# Patient Record
Sex: Female | Born: 1985 | Marital: Single | Smoking: Never smoker
Health system: Southern US, Community
[De-identification: ages and names within clinical notes are randomized; demographics above are authoritative.]

## PROBLEM LIST (undated history)

## (undated) DIAGNOSIS — L309 Dermatitis, unspecified: Principal | ICD-10-CM

## (undated) DIAGNOSIS — L500 Allergic urticaria: Secondary | ICD-10-CM

## (undated) HISTORY — DX: Allergic urticaria: L50.0

## (undated) HISTORY — DX: Dermatitis, unspecified: L30.9

---

## 2015-12-01 ENCOUNTER — Other Ambulatory Visit: Payer: Self-pay | Admitting: Infectious Disease

## 2015-12-01 ENCOUNTER — Ambulatory Visit
Admission: RE | Admit: 2015-12-01 | Discharge: 2015-12-01 | Disposition: A | Discharge status: Home / Self Care | Payer: No Typology Code available for payment source | Source: Ambulatory Visit | Attending: Infectious Disease | Admitting: Infectious Disease

## 2015-12-01 DIAGNOSIS — R9389 Abnormal findings on diagnostic imaging of other specified body structures: Secondary | ICD-10-CM

## 2016-05-09 ENCOUNTER — Encounter: Payer: Self-pay | Admitting: Allergy and Immunology

## 2016-05-09 ENCOUNTER — Ambulatory Visit (INDEPENDENT_AMBULATORY_CARE_PROVIDER_SITE_OTHER): Payer: BLUE CROSS/BLUE SHIELD | Admitting: Allergy and Immunology

## 2016-05-09 VITALS — BP 126/74 | HR 92 | Temp 98.2°F | Resp 20 | Ht 65.0 in | Wt 130.6 lb

## 2016-05-09 DIAGNOSIS — L500 Allergic urticaria: Secondary | ICD-10-CM

## 2016-05-09 DIAGNOSIS — L309 Dermatitis, unspecified: Secondary | ICD-10-CM

## 2016-05-09 DIAGNOSIS — L253 Unspecified contact dermatitis due to other chemical products: Secondary | ICD-10-CM | POA: Insufficient documentation

## 2016-05-09 HISTORY — DX: Dermatitis, unspecified: L30.9

## 2016-05-09 HISTORY — DX: Allergic urticaria: L50.0

## 2016-05-09 NOTE — Assessment & Plan Note (Addendum)
The patient's history and negative skin test results suggest the possibility of contact dermatitis. We will proceed with patch testing.  She will return for T.R.U.E. patch test placement.  Instructions have been provided regarding keeping the patches clean and dry as well as returning at appropriate intervals for patch test interpretation.  Continue to avoid suspected triggers of dermatitis and continue topical corticosteroid as needed.

## 2016-05-09 NOTE — Progress Notes (Signed)
New Patient Note  RE: Catherine Kirk MRN: [id number] DOB: 1985/06/13 Date of Office Visit: 05/09/2016  Referring provider: No ref. provider found Primary care provider: No PCP Per Patient  Chief Complaint: Rash   History of present illness: Catherine Kirk is a 31 y.o. female presenting today for evaluation of rash.  She reports that since she was approximately 31 years old she has been developing a rash, particularly on her arms, axillae, and on the backs of her legs.  The rash is described as small red bumps, occasionally with vesicles which are pruritic and come and go.  She believes that the rash is triggered by contact with certain fabrics, cosmetics, and deodorants.  She has had to discontinue using deodorants because of the discomfort it was causing to her axillae.  She grew up in the [place] and was told by a physician there to avoid food dyes as they may be contributing to the rash.   Assessment and plan: Dermatitis The patient's history and negative skin test results suggest the possibility of contact dermatitis. We will proceed with patch testing.  She will return for T.R.U.E. patch test placement.  Instructions have been provided regarding keeping the patches clean and dry as well as returning at appropriate intervals for patch test interpretation.  Continue to avoid suspected triggers of dermatitis and continue topical corticosteroid as needed.   Diagnostics: Environmental skin testing:  Negative despite a positive histamine control. Food allergen skin testing:  Negative despite a positive histamine control.    Physical examination: Blood pressure 126/74, pulse 92, temperature 98.2 F (36.8 C), temperature source Oral, resp. rate 20, height  (1.651 m), weight 130 lb 9.6 oz (59.2 kg), SpO2 97 %.  General: Alert, interactive, in no acute distress. HEENT: TMs pearly gray, turbinates mildly edematous without discharge, post-pharynx  unremarkable. Neck: Supple without lymphadenopathy. Lungs: Clear to auscultation without wheezing, rhonchi or rales. CV: Normal S1, S2 without murmurs. Abdomen: Nondistended, nontender. Skin: Warm and dry, without lesions or rashes. Extremities:  No clubbing, cyanosis or edema. Neuro:   Grossly intact.  Review of systems:  Review of systems negative except as noted in HPI / PMHx or noted below: Review of Systems  Constitutional: Negative.   HENT: Negative.   Eyes: Negative.   Respiratory: Negative.   Cardiovascular: Negative.   Gastrointestinal: Negative.   Genitourinary: Negative.   Musculoskeletal: Negative.   Skin: Negative.   Neurological: Negative.   Endo/Heme/Allergies: Negative.   Psychiatric/Behavioral: Negative.     Past medical history:  Past Medical History:  Diagnosis Date  . 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 Dermatitis 05/09/2016    Past surgical history:  Past Surgical History:  Procedure Laterality Date  . CESAREAN SECTION  2015    Family history: Family History  Problem Relation Age of Onset  . Allergic rhinitis Neg Hx   . Angioedema Neg Hx   . Asthma Neg Hx   . Eczema Neg Hx   . Immunodeficiency Neg Hx   . Urticaria Neg Hx     Social history: Social History   Social History  . Marital status: Single    Spouse name: N/A  . Number of children: N/A  . Years of education: N/A   Occupational History  . Not on file.   Social History Main Topics  . Smoking status: Never Smoker  . Smokeless tobacco: Never Used  . Alcohol use Not on file  . Drug use: Unknown  . Sexual activity: Not  on file   Other Topics Concern  . Not on file   Social History Narrative  . No narrative on file   Environmental History: The patient lives in a house with carpeting throughout, gas heat, and central air.  She is a nonsmoker without pets.  There is no known mold/water damage in the home.  Allergies as of 05/09/2016   No Known Allergies     Medication List     as of 05/09/2016  3:00 PM   You have not been prescribed any medications.     Known medication allergies: No Known Allergies  I appreciate the opportunity to take part in Robyne's care. Please do not hesitate to contact me with questions.  Sincerely,   R. Jorene Guest, MD

## 2016-05-09 NOTE — Patient Instructions (Addendum)
Dermatitis The patient's history and negative skin test results suggest the possibility of contact dermatitis. We will proceed with patch testing.  She will return for T.R.U.E. patch test placement.  Instructions have been provided regarding keeping the patches clean and dry as well as returning at appropriate intervals for patch test interpretation.  Continue to avoid suspected triggers of dermatitis and continue topical corticosteroid as needed.   Return in about 2 days (around 05/11/2016) for patch test placement.

## 2016-05-11 ENCOUNTER — Ambulatory Visit (INDEPENDENT_AMBULATORY_CARE_PROVIDER_SITE_OTHER): Payer: BLUE CROSS/BLUE SHIELD | Admitting: Allergy & Immunology

## 2016-05-11 ENCOUNTER — Encounter: Payer: Self-pay | Admitting: Allergy & Immunology

## 2016-05-11 VITALS — BP 108/70 | HR 96 | Temp 98.5°F | Resp 18 | Ht 65.0 in | Wt 130.0 lb

## 2016-05-11 DIAGNOSIS — L309 Dermatitis, unspecified: Secondary | ICD-10-CM | POA: Diagnosis not present

## 2016-05-11 NOTE — Progress Notes (Signed)
FOLLOW UP  Date of Service/Encounter:  05/11/16   Assessment:   Dermatitis    Plan/Recommendations:   1. Dermatitis - TRUE Test patches placed today. - Come back on Friday for the first read and then on Monday for the second read.  - Hopefully this will help [place] determine which chemicals are causing the reactions.  2. Return in about 2 days (around 05/13/2016).    Subjective:   Catherine Kirk is a 31 y.o. female presenting today for follow up of  Chief Complaint  Patient presents with  . Allergies    patch testing    Catherine Kirk has a history of the following: Patient Active Problem List   Diagnosis Date Noted  . 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 Dermatitis 05/09/2016    History obtained from: chart review and patient.  Catherine Kirk was referred by No PCP Per Patient.      Catherine Kirk is a 31 y.o. female presenting for a follow up visit. She was last seen two days ago by Dr. Nunzio Cobbs for evaluation of a rash. At that time, allergy testing including environmentals as well as foods were negative with adequate controls. Dr. Nunzio Cobbs felt that this was likely related to a contact dermatitis and recommended patch testing, which brought her in today.   Since last visit, she is doing well. She had no residual itching from her skin testing from 2 days ago. She remains on Cerve lotion which she has used for 2 weeks and seems to be working well. She rarely has outbreaks at this time because she is avoiding quite a large lots of water and chemicals. He only uses certain lotions and wears certain clothes. She is interested in narrowing down what chemical might be triggering the rash. She endorses feeling good today without any adverse symptoms. She has not been on any antihistamines.  Otherwise, there have been no changes to her past medical history, surgical history, family history, or social history. She was born in the [place] and recently moved to the [place]  roughly one year ago. Her mother lives in [place], which brought her to this area. 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 She is in the middle of learning English so that she can establish her practice here in the [place].    Review of Systems: a 14-point review of systems is pertinent for what is mentioned in HPI.  Otherwise, all other systems were negative. Constitutional: negative other than that listed in the HPI Eyes: negative other than that listed in the HPI Ears, nose, mouth, throat, and face: negative other than that listed in the HPI Respiratory: negative other than that listed in the HPI Cardiovascular: negative other than that listed in the HPI Gastrointestinal: negative other than that listed in the HPI Genitourinary: negative other than that listed in the HPI Integument: negative other than that listed in the HPI Hematologic: negative other than that listed in the HPI Musculoskeletal: negative other than that listed in the HPI Neurological: negative other than that listed in the HPI Allergy/Immunologic: negative other than that listed in the HPI    Objective:   Blood pressure 108/70, pulse 96, temperature 98.5 F (36.9 C), temperature source Oral, resp. rate 18, height  (1.651 m), weight 130 lb (59 kg), SpO2 98 %. Body mass index is 21.63 kg/m.   Physical Exam:  General: Alert, interactive, in no acute distress. Pleasant female.  Eyes: No conjunctival injection present on the right, No conjunctival injection present on the left, PERRL bilaterally, No discharge on the right, No discharge on the left and No Horner-Trantas dots present Ears: Right TM pearly gray with normal light reflex, Left TM pearly gray with normal light reflex, Right TM intact without perforation and Left TM intact without perforation.  Nose/Throat: External nose within normal limits and septum midline, turbinates edematous without discharge,  post-pharynx mildly erythematous without cobblestoning in the posterior oropharynx. Tonsils 2+ without exudates Neck: Supple without thyromegaly. Lungs: Clear to auscultation without wheezing, rhonchi or rales. No increased work of breathing. CV: Normal S1/S2, no murmurs. Capillary refill <2 seconds.  Skin: Warm and dry, without lesions or rashes. Back is mostly clear with 2-3 isolated hyperpigmented lesions.  Neuro:   Grossly intact. No focal deficits appreciated. Responsive to questions.   Diagnostic studies: none     Catherine Bonds, MD [facility]

## 2016-05-11 NOTE — Patient Instructions (Addendum)
1. Dermatitis - TRUE Test patches placed today. - Come back on Friday for the first read and then on Monday for the second read.  - Hopefully this will help [place] determine which chemicals are causing the reactions.  2. Return in about 2 days (around 05/13/2016).  Please inform us of any Emergency Department visits, hospitalizations, or changes in symptoms. Call us before going to the ED for breathing or allergy symptoms since we might be able to fit you in for a sick visit. Feel free to contact us anytime with any questions, problems, or concerns.  It was a pleasure to meet you today! Happy spring! Good luck with the dental practice!   Websites that have reliable patient information: 1. American Academy of Asthma, Allergy, and Immunology: www.aaaai.org 2. Food Allergy Research and Education (FARE): foodallergy.org 3. Mothers of Asthmatics: http://www.asthmacommunitynetwork.org 4. American College of Allergy, Asthma, and Immunology: www.acaai.org

## 2016-05-13 ENCOUNTER — Ambulatory Visit: Payer: BLUE CROSS/BLUE SHIELD | Admitting: Allergy

## 2016-05-13 DIAGNOSIS — L309 Dermatitis, unspecified: Secondary | ICD-10-CM

## 2016-05-13 NOTE — Progress Notes (Signed)
    Follow-up Note  RE: Fronia Depass MRN: [id number] DOB: 04-Aug-1985 Date of Office Visit: 05/13/2016  Primary care provider: No PCP Per Patient Referring provider: No ref. provider found   Janyiah returns to the office today for the initial patch test interpretation, given suspected history of contact dermatitis.    Diagnostics:  TRUE TEST 48 hour reading:  Positive for #28 gold with erythematous papule.  #1 nickel, #7 colophony, #25 diazolidinyl urea, #36 2-bromo-2-nitropropane-1,3-diol were all mild irritant reactions with scaling.    Plan:  Allergic contact dermatitis  The patient has been provided detailed information regarding the substances she is sensitive to, as well as products containing the substances.  Meticulous avoidance of these substances is recommended. If avoidance is not possible, the use of barrier creams or lotions is recommended. If symptoms persist or progress despite meticulous avoidance of above substances, dermatology evaluation may be warranted.  Asthma control goals:   Full participation in all desired activities (may need albuterol before activity)  Albuterol use two time or less a week on average (not counting use with activity)  Cough interfering with sleep two time or less a month  Oral steroids no more than once a year  No hospitalizations  Margo Aye, MD [facility]

## 2016-05-16 ENCOUNTER — Encounter: Payer: Self-pay | Admitting: Allergy and Immunology

## 2016-05-16 ENCOUNTER — Ambulatory Visit: Payer: BLUE CROSS/BLUE SHIELD | Admitting: Allergy and Immunology

## 2016-05-16 DIAGNOSIS — L253 Unspecified contact dermatitis due to other chemical products: Secondary | ICD-10-CM

## 2016-05-16 NOTE — Assessment & Plan Note (Signed)
   The patient has been provided detailed information regarding the substances she is sensitive to, as well as products containing the substances.  Meticulous avoidance of these substances is recommended. If avoidance is not possible, the use of barrier creams or lotions is recommended.  If symptoms persist or progress despite meticulous avoidance of Balsam of [place] and Gold Sodium Thiosulfate, dermatology evaluation may be warranted.

## 2016-05-16 NOTE — Patient Instructions (Addendum)
Contact dermatitis  The patient has been provided detailed information regarding the substances she is sensitive to, as well as products containing the substances.  Meticulous avoidance of these substances is recommended. If avoidance is not possible, the use of barrier creams or lotions is recommended.  If symptoms persist or progress despite meticulous avoidance of Balsam of [place] and Gold Sodium Thiosulfate, dermatology evaluation may be warranted.

## 2016-05-16 NOTE — Progress Notes (Signed)
    Follow-up Note  RE: Catherine Kirk MRN: [id number] DOB: 17-Apr-1985 Date of Office Visit: 05/16/2016  Primary care provider: No PCP Per Patient Referring provider: No ref. provider found   Catherine Kirk returns to the office today for the final patch test interpretation, given suspected history of contact dermatitis.    Diagnostics:  TRUE TEST 96 hour reading: Positive to [place] of [place] and Gold Sodium Thiosulfate.  Assessment and Plan:   Allergic contact dermatitis  The patient has been provided detailed information regarding the substances she is sensitive to, as well as products containing the substances.  Meticulous avoidance of these substances is recommended. If avoidance is not possible, the use of barrier creams or lotions is recommended.  If symptoms persist or progress despite meticulous avoidance of Balsam of [place] and Gold Sodium Thiosulfate, dermatology evaluation may be warranted.

## 2017-11-24 IMAGING — CR DG CHEST 1V
1 series · 1 of 1 positions shown · non-contrast
Comparison: None.

CLINICAL DATA: Positive PPD

EXAM:
CHEST 1 VIEW

[w chest pa]
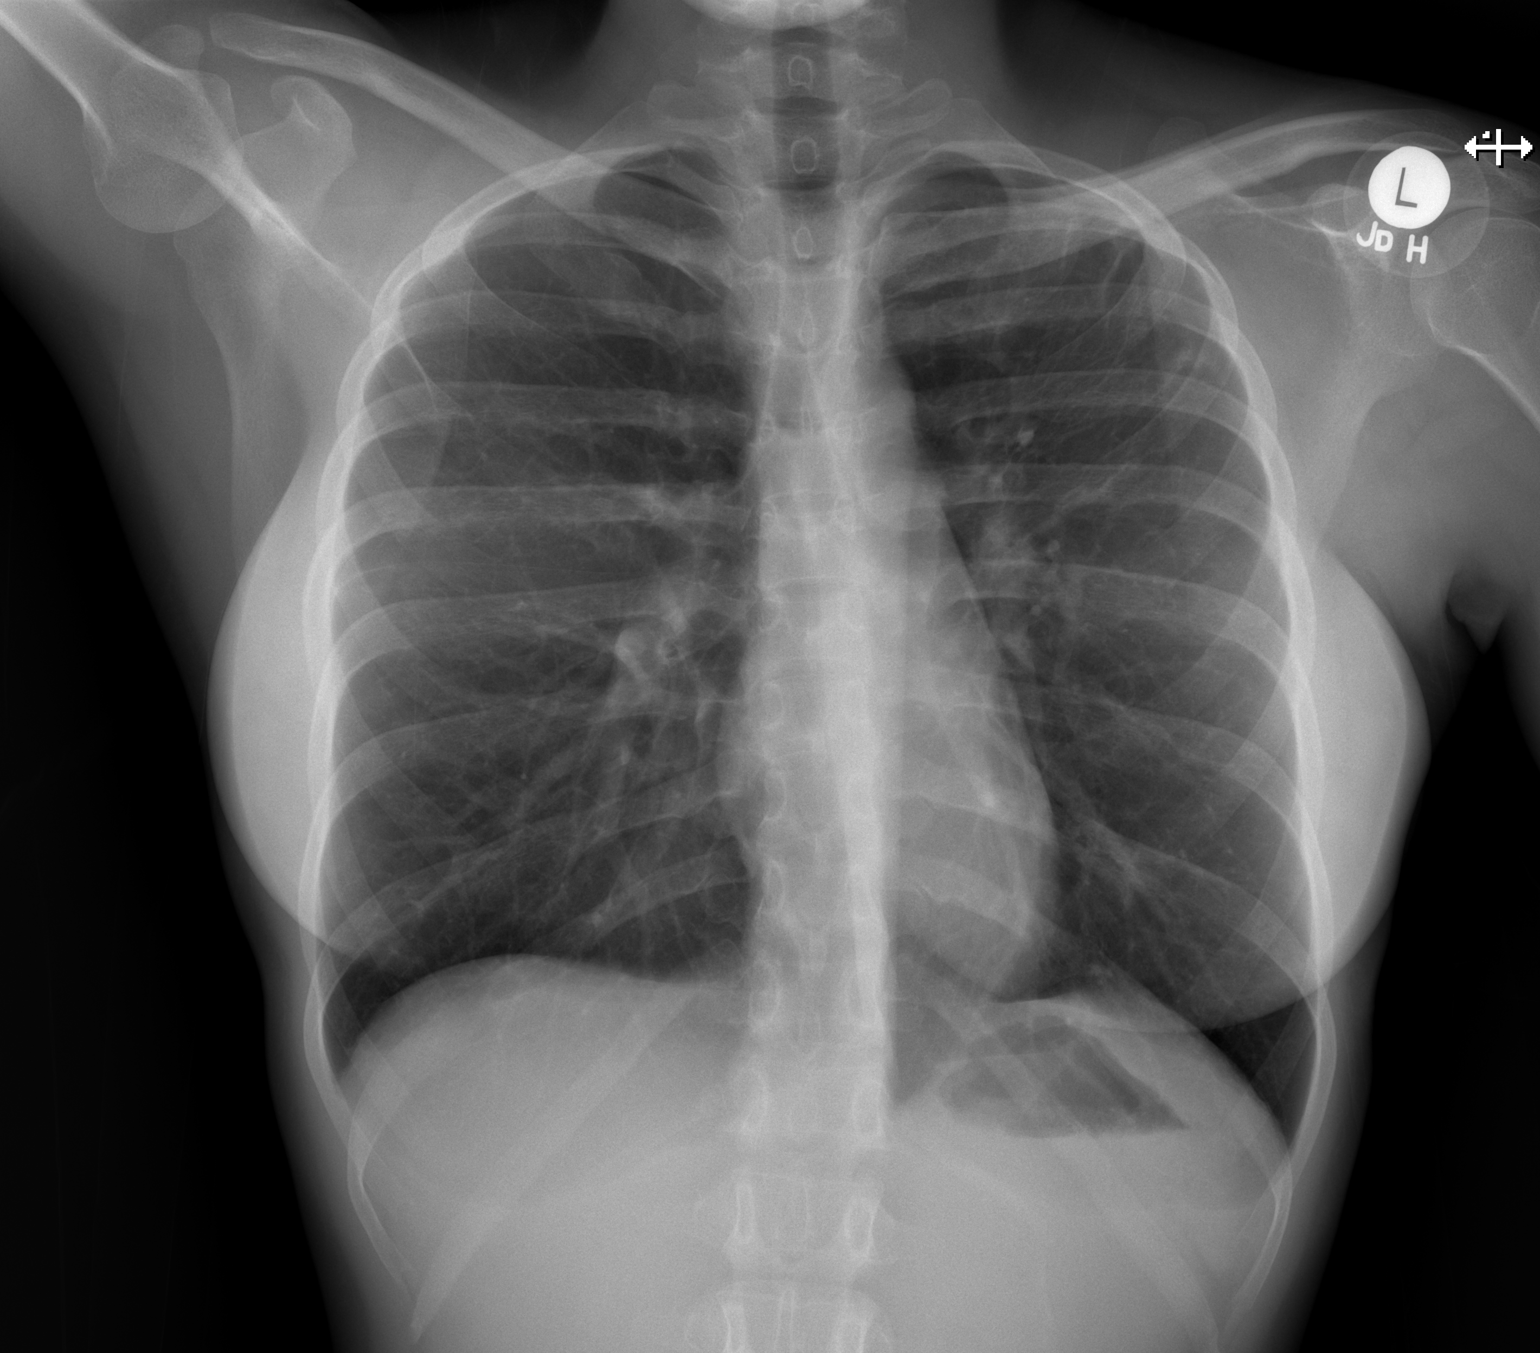

[1 of 1 positions shown; findings below may reference images not displayed]

FINDINGS: Normal heart size. Lungs clear. No pneumothorax. No pleural
effusion.
IMPRESSION: No active disease.

## 2018-01-23 ENCOUNTER — Other Ambulatory Visit: Payer: Self-pay

## 2018-01-23 ENCOUNTER — Encounter: Payer: Self-pay | Admitting: Physical Therapy

## 2018-01-23 ENCOUNTER — Ambulatory Visit: Payer: BLUE CROSS/BLUE SHIELD | Attending: Obstetrics & Gynecology | Admitting: Physical Therapy

## 2018-01-23 DIAGNOSIS — M62838 Other muscle spasm: Secondary | ICD-10-CM | POA: Insufficient documentation

## 2018-01-23 DIAGNOSIS — M6281 Muscle weakness (generalized): Secondary | ICD-10-CM | POA: Diagnosis present

## 2018-01-23 NOTE — Therapy (Signed)
[facility] [address], [address] [place], [place], [address] Phone: [phone]   Fax:  [phone]  Physical Therapy Evaluation  Patient Details  Name: Catherine Kirk MRN: [id number] Date of Birth: Mar 30, 1985 Referring Provider (PT): Dr. Hoover BrownsEma Kulwa   Encounter Date: 01/23/2018  PT End of Session - 01/23/18 1111    Visit Number  1    Date for PT Re-Evaluation  04/17/18    PT Start Time  1015    PT Stop Time  1050    PT Time Calculation (min)  35 min    Activity Tolerance  Patient tolerated treatment well    Behavior During Therapy  [facility] for tasks assessed/performed       Past Medical History:  Diagnosis Date  . 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 Dermatitis 05/09/2016    Past Surgical History:  Procedure Laterality Date  . CESAREAN SECTION  2015    There were no vitals filed for this visit.   Subjective Assessment - 01/23/18 1023    Subjective  Patient reports pain since she has been having sex. Patient started intercourse around 32 years old. Pain at the entrance of penetration. Sometimes has pain afterwards/     Patient Stated Goals  reduce pain with intercourse    Currently in Pain?  Yes    Pain Score  7     Pain Location  Vagina    Pain Orientation  Mid    Pain Descriptors / Indicators  Cramping;Burning;Sore    Pain Type  Chronic pain    Pain Onset  More than a month ago    Pain Frequency  Intermittent    Aggravating Factors   tampons, intercourse, vaginal exams; wearing tight pants    Pain Relieving Factors  no penetration in the vaginal canal         [facility] - 01/23/18 0001      Assessment   Medical Diagnosis  N94.10 unspecified dyspareunia    Referring Provider (PT)  Dr. Hoover BrownsEma Kulwa    Prior Therapy  none      Precautions   Precautions  None      Restrictions   Weight Bearing Restrictions  No      Balance Screen   Has the patient fallen in the past 6 months  No    Has the  patient had a decrease in activity level because of a fear of falling?   No    Is the patient reluctant to leave their home because of a fear of falling?   No      Home Public house managernvironment   Living Environment  Private residence      Prior Function   Level of Independence  Independent    Leisure  workout      Cognition   Overall Cognitive Status  Within Functional Limits for tasks assessed      Observation/Other Assessments   Skin Integrity  tightness in the c-section scar      Posture/Postural Control   Posture/Postural Control  No significant limitations      ROM / Strength   AROM / PROM / Strength  AROM;PROM;Strength      Strength   Overall Strength Comments  abdominal strength 2/5    Right Hip Extension  4/5    Right Hip ABduction  4/5      Palpation   Palpation comment  tenderness suprapubic area  Objective measurements completed on examination: See above findings.    Pelvic Floor Special Questions - 01/23/18 0001    Prior Pregnancies  Yes    Number of Pregnancies  1    Number of C-Sections  1    Currently Sexually Active  Yes    Is this Painful  Yes    Marinoff Scale  pain prevents any attempts at intercourse    Urinary Leakage  No    Urinary urgency  No    Urinary frequency  n    Fecal incontinence  No    Pelvic Floor Internal Exam  Patient confirms indentification and approves PT to assess pelvic floor and treat    Exam Type  Vaginal    Palpation  very tender in the introitus, perineal body,        [facility] Adult PT Treatment/Exercise - 01/23/18 0001      Self-Care   Self-Care  Other Self-Care Comments    Other Self-Care Comments   how to perform perineal massage; you tube videos on pelvic floor meditation and pelvic floor stretches             PT Education - 01/23/18 1109    Education Details  perineal massage; you tube video for pelvic floor meditation and pelvic floor stretches    Person(s) Educated  Patient    Methods   Explanation;Demonstration;Handout    Comprehension  Returned demonstration;Verbalized understanding       PT Short Term Goals - 01/23/18 1117      PT SHORT TERM GOAL #1   Title  independent with initial HEP    Time  4    Period  Weeks    Status  New    Target Date  02/20/18      PT SHORT TERM GOAL #2   Title  understand how to perform perineal massage to hav ethe tissue soften up    Time  4    Period  Weeks    Status  New    Target Date  02/20/18      PT SHORT TERM GOAL #3   Title  starting to use dilators to expand the perineal canal for penile penetration    Time  4    Period  Weeks    Status  New    Target Date  02/20/18      PT SHORT TERM GOAL #4   Title  pain with intercourse decreased >/= 25%    Time  4    Period  Weeks    Status  Deferred    Target Date  02/20/18        PT Long Term Goals - 01/23/18 1207      PT LONG TERM GOAL #1   Title  independent with HEP and understand how to progress herself    Time  12    Period  Weeks    Status  New    Target Date  04/17/18      PT LONG TERM GOAL #2   Title  ability to use the largest dilator to expand the vaginal canal for intercourse    Time  12    Period  Weeks    Status  New    Target Date  04/17/18      PT LONG TERM GOAL #3   Title  penile penetration in vaginal canal decreased >/= 80% due to improved mobility of the vaginal canal    Time  12  Period  Weeks    Status  New    Target Date  04/17/18      PT LONG TERM GOAL #4   Title  abdominal strength >/= 3/5 to perform daily tasks    Time  12    Period  Weeks    Status  New    Target Date  04/17/18             Plan - 01/23/18 1157    Clinical Impression Statement  Patient is a 32 year old female with dyspareunia since she was 32 years old. Patient reports the pain is 7/10 with initial penetration and sometimes will prevent initial penetration. Patient reports pain with wearing a tampon, tight pants and vaginal exams. Patient reports the  vaginal canal feels like it will not open. C-section scar is tight and the surrounding tissue. Right hip abduction and extension 4/5. Painful to touch to perineal body, intoritus. Therapist wasn only able to place 1 mm of her index finger into the vaginal canal. Patient will benefit from skilled therapy to improve tissue mobility and reduce pain for vaginal penetration.     History and Personal Factors relevant to plan of care:  c-section 2015    Clinical Presentation  Stable    Clinical Presentation due to:  stable condition    Clinical Decision Making  Low    Rehab Potential  Excellent    Clinical Impairments Affecting Rehab Potential  none pertinent    PT Frequency  1x / week    PT Duration  12 weeks    PT Treatment/Interventions  Biofeedback;Cryotherapy;Electrical Stimulation;Ultrasound;Neuromuscular re-education;Therapeutic exercise;Therapeutic activities;Patient/family education;Manual techniques;Dry needling    PT Next Visit Plan  soft tissue work to lower abdominal area; myofascial release to the pelvic floor; list of moisturizers and lubricants    Consulted and Agree with Plan of Care  Patient       Patient will benefit from skilled therapeutic intervention in order to improve the following deficits and impairments:  Increased fascial restricitons, Pain, Decreased mobility, Increased muscle spasms, Decreased activity tolerance, Decreased endurance, Decreased strength  Visit Diagnosis: Other muscle spasm - Plan: PT plan of care cert/re-cert  Muscle weakness (generalized) - Plan: PT plan of care cert/re-cert     Problem List Patient Active Problem List   Diagnosis Date Noted  . 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 Contact dermatitis 05/09/2016    Eulis Fosterheryl Talyia Allende, PT 01/23/18 12:12 PM   [facility] [address], [address] [place], [place], [address] Phone: [phone]   Fax:  [phone]  Name: Catherine Kirk MRN:  [id number] Date of Birth: 1985-09-14

## 2018-01-23 NOTE — Patient Instructions (Signed)
Circular massage around the introitus for 2 minutes every day.   Pelvic Floor Release Stretches (NEW)  [employer]      Guided Meditation for Pelvic Floor Relaxation  [employer]     [employer]  [facility] [address], [address] [place], [place] [address] Phone # [phone] Fax [phone]

## 2018-02-05 ENCOUNTER — Ambulatory Visit: Payer: BLUE CROSS/BLUE SHIELD | Attending: Obstetrics & Gynecology | Admitting: Physical Therapy

## 2018-02-05 ENCOUNTER — Encounter: Payer: Self-pay | Admitting: Physical Therapy

## 2018-02-05 DIAGNOSIS — M62838 Other muscle spasm: Secondary | ICD-10-CM | POA: Insufficient documentation

## 2018-02-05 DIAGNOSIS — M6281 Muscle weakness (generalized): Secondary | ICD-10-CM | POA: Diagnosis present

## 2018-02-05 NOTE — Patient Instructions (Signed)
STRETCHING THE PELVIC FLOOR MUSCLES NO DILATOR  Supplies . Vaginal lubricant . Mirror (optional) . Gloves (optional) Positioning . Start in a semi-reclined position with your head propped up. Bend your knees and place your thumb or finger at the vaginal opening. Procedure . Apply a moderate amount of lubricant on the outer skin of your vagina, the labia minora.  Apply additional lubricant to your finger. . Spread the skin away from the vaginal opening. Place the end of your finger at the opening. . Do a maximum contraction of the pelvic floor muscles. Tighten the vagina and the anus maximally and relax. . When you know they are relaxed, gently and slowly insert your finger into your vagina, directing your finger slightly downward, for 2-3 inches of insertion. . Relax and stretch the 6 o'clock position . Hold each stretch for _2 min__ and repeat __1_ time with rest breaks of _1__ seconds between each stretch. . Repeat the stretching in the 4 o'clock and 8 o'clock positions. . Total time should be _6__ minutes, _1__ x per day.  Note the amount of theme your were able to achieve and your tolerance to your finger in your vagina. . Once you have accomplished the techniques you may try them in standing with one foot resting on the tub, or in other positions.  This is a good stretch to do in the shower if you don't need to use lubricant.   [facility] [address], [address] [place], [place] [address] Phone # [phone] Fax [phone]  

## 2018-02-05 NOTE — Therapy (Signed)
[facility] [address] [place], [place], [address] Phone: [phone]   Fax:  [phone]  Physical Therapy Treatment  Patient Details  Name: Catherine Kirk MRN: [id number] Date of Birth: 05-Apr-1985 Referring Provider (PT): Dr. Waymon Amato   Encounter Date: 02/05/2018  PT End of Session - 02/05/18 1042    Visit Number  2    Date for PT Re-Evaluation  04/17/18    Authorization Type  BCBS    PT Start Time  0930    PT Stop Time  1015    PT Time Calculation (min)  45 min    Activity Tolerance  Patient tolerated treatment well    Behavior During Therapy  [facility] for tasks assessed/performed       Past Medical History:  Diagnosis Date  . 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 Dermatitis 05/09/2016    Past Surgical History:  Procedure Laterality Date  . CESAREAN SECTION  2015    There were no vitals filed for this visit.  Subjective Assessment - 02/05/18 0940    Subjective  I have questions about the treattment. I feel like my husband works with me.     Patient Stated Goals  reduce pain with intercourse    Currently in Pain?  Yes    Pain Score  7     Pain Location  Vagina    Pain Orientation  Mid    Pain Descriptors / Indicators  Cramping;Burning;Sore    Pain Type  Chronic pain    Pain Onset  More than a month ago    Pain Frequency  Intermittent    Aggravating Factors   tampons, intercourse, vaginal exam; wearing tight pants.     Pain Relieving Factors  NO penetration in the vaginal canal    Multiple Pain Sites  No                       [facility] Adult PT Treatment/Exercise - 02/05/18 0001      Exercises   Exercises  Lumbar      Lumbar Exercises: Stretches   Press Ups  5 reps    Quadruped Mid Back Stretch  3 reps;30 seconds   all 3 directions   Piriformis Stretch  Left;Right;1 rep;30 seconds   supine   Other Lumbar Stretch Exercise  happy baby stretch with diaphragmatic breathing       Lumbar Exercises: Quadruped   Madcat/Old Horse  10 reps      Manual Therapy   Manual Therapy  Myofascial release;Internal Pelvic Floor    Myofascial Release  along bil. hip adductor; release of levator ani fascia with external palpation    Internal Pelvic Floor  along the introitus, stretch the perineal body, along the bulbocavernosus             PT Education - 02/05/18 1037    Education Details  perineal massage with internal work; continues with stretches; pelvic floor meditation    Person(s) Educated  Patient    Methods  Explanation;Demonstration;Verbal cues;Handout    Comprehension  Verbalized understanding;Returned demonstration       PT Short Term Goals - 01/23/18 1117      PT SHORT TERM GOAL #1   Title  independent with initial HEP    Time  4    Period  Weeks    Status  New    Target Date  02/20/18      PT SHORT TERM GOAL #2  Title  understand how to perform perineal massage to hav ethe tissue soften up    Time  4    Period  Weeks    Status  New    Target Date  02/20/18      PT SHORT TERM GOAL #3   Title  starting to use dilators to expand the perineal canal for penile penetration    Time  4    Period  Weeks    Status  New    Target Date  02/20/18      PT SHORT TERM GOAL #4   Title  pain with intercourse decreased >/= 25%    Time  4    Period  Weeks    Status  Deferred    Target Date  02/20/18        PT Long Term Goals - 01/23/18 1207      PT LONG TERM GOAL #1   Title  independent with HEP and understand how to progress herself    Time  12    Period  Weeks    Status  New    Target Date  04/17/18      PT LONG TERM GOAL #2   Title  ability to use the largest dilator to expand the vaginal canal for intercourse    Time  12    Period  Weeks    Status  New    Target Date  04/17/18      PT LONG TERM GOAL #3   Title  penile penetration in vaginal canal decreased >/= 80% due to improved mobility of the vaginal canal    Time  12    Period   Weeks    Status  New    Target Date  04/17/18      PT LONG TERM GOAL #4   Title  abdominal strength >/= 3/5 to perform daily tasks    Time  12    Period  Weeks    Status  New    Target Date  04/17/18            Plan - 02/05/18 2979    Clinical Impression Statement  Patint has not met goals due to just starting therapy. Patient was able to tolerate deeper penetration of the therapist finger for 1.5 inches. Patient has not been consistent with her stretches and pelvic floor mediation. Patient has tight hip adductors. Patient will benefit from skilled therapy to improve tissue mobility and reduce pain for vaginal penetration.     Rehab Potential  Excellent    Clinical Impairments Affecting Rehab Potential  none pertinent    PT Frequency  1x / week    PT Duration  12 weeks    PT Treatment/Interventions  Biofeedback;Cryotherapy;Electrical Stimulation;Ultrasound;Neuromuscular re-education;Therapeutic exercise;Therapeutic activities;Patient/family education;Manual techniques;Dry needling    PT Next Visit Plan  soft tissue work to lower abdominal area; myofascial release to the pelvic floor; list of moisturizers and lubricants; dry needling to hip adductors    Consulted and Agree with Plan of Care  Patient       Patient will benefit from skilled therapeutic intervention in order to improve the following deficits and impairments:  Increased fascial restricitons, Pain, Decreased mobility, Increased muscle spasms, Decreased activity tolerance, Decreased endurance, Decreased strength  Visit Diagnosis: Other muscle spasm  Muscle weakness (generalized)     Problem List Patient Active Problem List   Diagnosis Date Noted  . 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 Contact dermatitis 05/09/2016  Earlie Counts, PT 02/05/18 12:33 PM   [facility] [address] [place], [place], [address] Phone: [phone]   Fax:   [phone]  Name: Catherine Kirk MRN: [id number] Date of Birth: 02/16/1985

## 2018-02-21 ENCOUNTER — Ambulatory Visit: Payer: BLUE CROSS/BLUE SHIELD | Admitting: Physical Therapy

## 2018-02-21 ENCOUNTER — Encounter: Payer: Self-pay | Admitting: Physical Therapy

## 2018-02-21 DIAGNOSIS — M62838 Other muscle spasm: Secondary | ICD-10-CM

## 2018-02-21 DIAGNOSIS — M6281 Muscle weakness (generalized): Secondary | ICD-10-CM

## 2018-02-21 NOTE — Patient Instructions (Addendum)
Lubrication . Used for intercourse to reduce friction . Avoid ones that have glycerin, warming gels, tingling gels, icing or cooling gel, scented . Avoid parabens due to a preservative similar to female sex hormone . May need to be reapplied once or several times during sexual activity . Can be applied to both partners genitals prior to vaginal penetration to minimize friction or irritation . Prevent irritation and mucosal tears that cause post coital pain and increased the risk of vaginal and urinary tract infections . Oil-based lubricants cannot be used with condoms due to breaking them down.  Least likely to irritate vaginal tissue.  . Plant based-lubes are safe . Silicone-based lubrication are thicker and last long and used for post-menopausal women  Vaginal Lubricators Here is a list of some suggested lubricators you can use for intercourse. Use the most hypoallergenic product.  You can place on you or your partner.   Slippery Stuff  Sylk or Sliquid Natural H2O ( good  if frequent UTI's)  Blossom Organics (www.blossom-organics.com)  Luvena   Coconut oil  PJur Woman Nude- water based lubricant, amazon  Uberlube- Amazon  Aloe Vera  Yes lubricant- WellPointmazon  Wet Platinum-Silicone, Target, [facility]  Olive and Bee intimate cream-  www.oliveandbee.com.au  Pink - Amazon Things to avoid in lubricants are glycerin, warming gels, tingling gels, icing or cooling  gels, and scented gels.  Also avoid Vaseline. KY jelly, Replens, and Astroglide kills good bacteria(lactobacilli)  Things to avoid in the vaginal area . Do not use things to irritate the vulvar area . No lotions- see below . No soaps; can use Aveeno or Calendula cleanser if needed. Must be gentle . No deodorants . No douches . Good to sleep without underwear to let the vaginal area to air out . No scrubbing: spread the lips to let warm water rinse over labias and pat dry  Creams that can be used on the Vulva  Area  V CIT Groupmagic-amazon  Vital V Wild Yam Salve  Julva- [employer]  MoonMaid Botanical Pro-Meno Wild Yam Cream  Desert Havest Releveum or Desert Harvest Gele     Trigger Point Franklin ResourcesDry Needling  . What is Trigger Point Dry Needling (DN)? o DN is a physical therapy technique used to treat muscle pain and dysfunction. Specifically, DN helps deactivate muscle trigger points (muscle knots).  o A thin filiform needle is used to penetrate the skin and stimulate the underlying trigger point. The goal is for a local twitch response (LTR) to occur and for the trigger point to relax. No medication of any kind is injected during the procedure.   . What Does Trigger Point Dry Needling Feel Like?  o The procedure feels different for each individual patient. Some patients report that they do not actually feel the needle enter the skin and overall the process is not painful. Very mild bleeding may occur. However, many patients feel a deep cramping in the muscle in which the needle was inserted. This is the local twitch response.   Marland Kitchen. How Will I feel after the treatment? o Soreness is normal, and the onset of soreness may not occur for a few hours. Typically this soreness does not last longer than two days.  o Bruising is uncommon, however; ice can be used to decrease any possible bruising.  o In rare cases feeling tired or nauseous after the treatment is normal. In addition, your symptoms may get worse before they get better, this period will typically not last longer than 24 hours.   .Marland Kitchen  What Can I do After My Treatment? o Increase your hydration by drinking more water for the next 24 hours. o You may place ice or heat on the areas treated that have become sore, however, do not use heat on inflamed or bruised areas. Heat often brings more relief post needling. o You can continue your regular activities, but vigorous activity is not recommended initially after the treatment for 24 hours. o DN is best combined with  other physical therapy such as strengthening, stretching, and other therapies.    [facility] [address], [address] [place], [place] [address] Phone # [phone] Fax [phone]   The "Pelvic Drop" to Release Pelvic Floor Tension: [facility] [address], [address] [place], [place] [address] Phone # [phone] Fax [phone]

## 2018-02-21 NOTE — Therapy (Signed)
[facility] [address] [place], [place], [address] Phone: [phone]   Fax:  [phone]  Physical Therapy Treatment  Patient Details  Name: Catherine Kirk MRN: [id number] Date of Birth: 15-Oct-1985 Referring Provider (PT): Dr. Hoover Browns   Encounter Date: 02/21/2018  PT End of Session - 02/21/18 0928    Visit Number  3    Date for PT Re-Evaluation  04/17/18    Authorization Type  BCBS    PT Start Time  0845    PT Stop Time  0928    PT Time Calculation (min)  43 min    Activity Tolerance  Patient tolerated treatment well    Behavior During Therapy  [facility] for tasks assessed/performed       Past Medical History:  Diagnosis Date  . 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 Dermatitis 05/09/2016    Past Surgical History:  Procedure Laterality Date  . CESAREAN SECTION  2015    There were no vitals filed for this visit.  Subjective Assessment - 02/21/18 0849    Subjective  I had less pain with intercourse. She felt tightness.     Patient Stated Goals  reduce pain with intercourse    Currently in Pain?  No/denies                    Pelvic Floor Special Questions - 02/21/18 0001    Pelvic Floor Internal Exam  Patient confirms indentification and approves PT to assess pelvic floor and treat    Exam Type  Vaginal        [facility] Adult PT Treatment/Exercise - 02/21/18 0001      Neuro Re-ed    Neuro Re-ed Details   pelvic floor drops to relax the perineal area with diaphragmatic breathing      Manual Therapy   Manual Therapy  Soft tissue mobilization;Myofascial release    Soft tissue mobilization  bilateral hip adductors    Internal Pelvic Floor  along the introitus, stretch the perineal body, along the bulbocavernosus       Trigger Point Dry Needling - 02/21/18 0857    Consent Given?  Yes    Education Handout Provided  Yes    Muscles Treated Lower Body  Adductor longus/brevius/maximus    Adductor Response  Twitch response elicited           PT Education - 02/21/18 0857    Education Details  education on dry needling, information on vaginal lubricants and moisturizers for the perineal area    Person(s) Educated  Patient    Methods  Explanation;Handout    Comprehension  Verbalized understanding       PT Short Term Goals - 02/21/18 0931      PT SHORT TERM GOAL #1   Title  independent with initial HEP    Time  4    Period  Weeks    Status  Achieved      PT SHORT TERM GOAL #2   Title  understand how to perform perineal massage to hav ethe tissue soften up    Time  4    Period  Weeks    Status  Achieved      PT SHORT TERM GOAL #3   Title  starting to use dilators to expand the perineal canal for penile penetration    Time  4    Period  Weeks    Status  New      PT SHORT TERM  GOAL #4   Title  pain with intercourse decreased >/= 25%    Time  4    Period  Weeks    Status  Achieved        PT Long Term Goals - 01/23/18 1207      PT LONG TERM GOAL #1   Title  independent with HEP and understand how to progress herself    Time  12    Period  Weeks    Status  New    Target Date  04/17/18      PT LONG TERM GOAL #2   Title  ability to use the largest dilator to expand the vaginal canal for intercourse    Time  12    Period  Weeks    Status  New    Target Date  04/17/18      PT LONG TERM GOAL #3   Title  penile penetration in vaginal canal decreased >/= 80% due to improved mobility of the vaginal canal    Time  12    Period  Weeks    Status  New    Target Date  04/17/18      PT LONG TERM GOAL #4   Title  abdominal strength >/= 3/5 to perform daily tasks    Time  12    Period  Weeks    Status  New    Target Date  04/17/18            Plan - 02/21/18 0848    Clinical Impression Statement  Patient was able to have intercourse with her husband and less pain. Patient husband reports less resistance with intercourse. Patient responded well  to the dry needling and elongation of the hip adductors. Patient had less discomfort with the myofascial release of the perineal area. Patient will benefit from skilled therapy to imporve tissue mobility and reduce pain for vaginal penetration.     Rehab Potential  Excellent    Clinical Impairments Affecting Rehab Potential  none pertinent    PT Frequency  1x / week    PT Duration  12 weeks    PT Treatment/Interventions  Biofeedback;Cryotherapy;Electrical Stimulation;Ultrasound;Neuromuscular re-education;Therapeutic exercise;Therapeutic activities;Patient/family education;Manual techniques;Dry needling    PT Next Visit Plan  soft tissue work to lower abdominal area; myofascial release to the pelvic floor more internally    Consulted and Agree with Plan of Care  Patient       Patient will benefit from skilled therapeutic intervention in order to improve the following deficits and impairments:  Increased fascial restricitons, Pain, Decreased mobility, Increased muscle spasms, Decreased activity tolerance, Decreased endurance, Decreased strength  Visit Diagnosis: Other muscle spasm  Muscle weakness (generalized)     Problem List Patient Active Problem List   Diagnosis Date Noted  . 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 Contact dermatitis 05/09/2016    Eulis Foster, PT 02/21/18 9:32 AM   [facility] [address], [address] [place], [place], [address] Phone: [phone]   Fax:  [phone]  Name: Catherine Kirk MRN: [id number] Date of Birth: 1985/04/06

## 2018-03-06 ENCOUNTER — Ambulatory Visit: Payer: BLUE CROSS/BLUE SHIELD | Attending: Obstetrics & Gynecology | Admitting: Physical Therapy

## 2018-03-06 ENCOUNTER — Encounter: Payer: Self-pay | Admitting: Physical Therapy

## 2018-03-06 DIAGNOSIS — M62838 Other muscle spasm: Secondary | ICD-10-CM | POA: Diagnosis not present

## 2018-03-06 DIAGNOSIS — M6281 Muscle weakness (generalized): Secondary | ICD-10-CM

## 2018-03-06 NOTE — Patient Instructions (Addendum)
  Creams to use externally on the Vulva area  Marathon Oil (good for for cancer patients that had radiation to the area)- [place] or [employer]bermaid.https://garcia-valdez.org/  V-magic cream - amazon  Julva-amazon  Vital "V Wild Yam salve ( help moisturize and help with thinning vulvar area, does have Beeswax  [employer] Pro-Meno Wild Yam Cream- Amazon  Desert Harvest Gele Cleo by Zane Herald labial moisturizer (Amazon,  Coconut oil  Things to avoid in the vaginal area . Do not use things to irritate the vulvar area . No lotions just specialized creams for the vulva area- Neogyn, V-magic, No soaps; can use Aveeno or Calendula cleanser if needed. Must be gentle . No deodorants . No douches . Good to sleep without underwear to let the vaginal area to air out . No scrubbing: spread the lips to let warm water rinse over labias and pat dry         Vaginismus.[facility] [address] [place], [place] [address] Phone # [phone] Fax [phone]

## 2018-03-06 NOTE — Therapy (Signed)
[facility] [address] [place], [place], [address] Phone: [phone]   Fax:  [phone]  Physical Therapy Treatment  Patient Details  Name: Catherine Kirk MRN: [id number] Date of Birth: 1985/02/08 Referring Provider (PT): Dr. Hoover Browns   Encounter Date: 03/06/2018  PT End of Session - 03/06/18 1004    Visit Number  4    Date for PT Re-Evaluation  04/17/18    Authorization Type  BCBS    PT Start Time  0930    PT Stop Time  1012    PT Time Calculation (min)  42 min    Activity Tolerance  Patient tolerated treatment well    Behavior During Therapy  [facility] for tasks assessed/performed       Past Medical History:  Diagnosis Date  . 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 Dermatitis 05/09/2016    Past Surgical History:  Procedure Laterality Date  . CESAREAN SECTION  2015    There were no vitals filed for this visit.  Subjective Assessment - 03/06/18 0940    Subjective  Pain level with intercourse was 10/10 and now is 5/10. Pain is 50% better. Has not tried using a tampon.    Patient Stated Goals  reduce pain with intercourse    Currently in Pain?  Yes    Pain Score  5     Pain Location  Vagina    Pain Orientation  Mid    Pain Descriptors / Indicators  Burning;Sore;Cramping    Pain Type  Chronic pain    Pain Onset  More than a month ago    Pain Frequency  Intermittent    Aggravating Factors   tampons, intercourse, vaginal exam     Pain Relieving Factors  no penetration in the vaginal canal    Multiple Pain Sites  No                    Pelvic Floor Special Questions - 03/06/18 0001    Pelvic Floor Internal Exam  Patient confirms indentification and approves PT to assess pelvic floor and treat    Exam Type  Vaginal        [facility] Adult PT Treatment/Exercise - 03/06/18 0001      Self-Care   Self-Care  Other Self-Care Comments    Other Self-Care Comments   where to buy cotton pantyliners to  reduce irrtation to the vulva area, using moisutrizers to the vulva area; using dilators and how they expand the vaginal canal and her options      Manual Therapy   Manual Therapy  Internal Pelvic Floor    Internal Pelvic Floor  along the introitus, stretch the perineal body, along the bulbocavernosus             PT Education - 03/06/18 1016    Education Details  education on dilators, vaginal moisturizers, and performing her perineal soft tissue work    [employer]) Educated  Patient    Methods  Explanation;Handout    Comprehension  Verbalized understanding       PT Short Term Goals - 02/21/18 0931      PT SHORT TERM GOAL #1   Title  independent with initial HEP    Time  4    Period  Weeks    Status  Achieved      PT SHORT TERM GOAL #2   Title  understand how to perform perineal massage to hav ethe tissue soften up  Time  4    Period  Weeks    Status  Achieved      PT SHORT TERM GOAL #3   Title  starting to use dilators to expand the perineal canal for penile penetration    Time  4    Period  Weeks    Status  New      PT SHORT TERM GOAL #4   Title  pain with intercourse decreased >/= 25%    Time  4    Period  Weeks    Status  Achieved        PT Long Term Goals - 03/06/18 4827      PT LONG TERM GOAL #1   Title  independent with HEP and understand how to progress herself    Baseline  still learning    Time  12    Period  Weeks    Status  On-going      PT LONG TERM GOAL #2   Title  ability to use the largest dilator to expand the vaginal canal for intercourse    Time  12    Period  Weeks    Status  On-going      PT LONG TERM GOAL #3   Title  penile penetration in vaginal canal decreased >/= 80% due to improved mobility of the vaginal canal    Baseline  50% better    Time  12    Period  Weeks    Status  On-going      PT LONG TERM GOAL #4   Title  abdominal strength >/= 3/5 to perform daily tasks    Time  12    Period  Weeks    Status  On-going             Plan - 03/06/18 1003    Clinical Impression Statement  Patient hs 50% less pain with intercourse. Patient continues to have tightness in the introitus. Patient is not doing her perineal stretches daily. Patient was recommeded to use vaginal moisturizers to the vulva area. Patient will benefit from skilled  therapy to improve tissu mobility and reduce pain for vaginal penetration.     Rehab Potential  Excellent    Clinical Impairments Affecting Rehab Potential  none pertinent    PT Frequency  1x / week    PT Duration  12 weeks    PT Treatment/Interventions  Biofeedback;Cryotherapy;Electrical Stimulation;Ultrasound;Neuromuscular re-education;Therapeutic exercise;Therapeutic activities;Patient/family education;Manual techniques;Dry needling    PT Next Visit Plan  soft tissue work to lower abdominal area; myofascial release to the pelvic floor more internally    Consulted and Agree with Plan of Care  Patient       Patient will benefit from skilled therapeutic intervention in order to improve the following deficits and impairments:  Increased fascial restricitons, Pain, Decreased mobility, Increased muscle spasms, Decreased activity tolerance, Decreased endurance, Decreased strength  Visit Diagnosis: Other muscle spasm  Muscle weakness (generalized)     Problem List Patient Active Problem List   Diagnosis Date Noted  . 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 Contact dermatitis 05/09/2016    Eulis Foster, PT 03/06/18 10:19 AM   [facility] [address] [place], [place], [address] Phone: [phone]   Fax:  [phone]  Name: Catherine Kirk MRN: [id number] Date of Birth: [birth year]

## 2018-03-20 ENCOUNTER — Ambulatory Visit: Payer: BLUE CROSS/BLUE SHIELD | Admitting: Physical Therapy

## 2018-03-20 DIAGNOSIS — M6281 Muscle weakness (generalized): Secondary | ICD-10-CM

## 2018-03-20 DIAGNOSIS — M62838 Other muscle spasm: Secondary | ICD-10-CM | POA: Diagnosis not present

## 2018-03-20 NOTE — Therapy (Signed)
[facility] [address], [address] [place], [place], [address] Phone: [phone]   Fax:  [phone]  Physical Therapy Treatment  Patient Details  Name: Catherine Kirk MRN: [id number] Date of Birth: [birth year] Referring Provider (PT): Dr. Hoover Browns   Encounter Date: 03/20/2018  PT End of Session - 03/20/18 0940    Visit Number  5    Date for PT Re-Evaluation  04/17/18    Authorization Type  BCBS    PT Start Time  0940   came late   PT Stop Time  1010    PT Time Calculation (min)  30 min    Activity Tolerance  Patient tolerated treatment well    Behavior During Therapy  [facility] for tasks assessed/performed       Past Medical History:  Diagnosis Date  . 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 Dermatitis 05/09/2016    Past Surgical History:  Procedure Laterality Date  . CESAREAN SECTION  2015    There were no vitals filed for this visit.  Subjective Assessment - 03/20/18 0940    Subjective  I am still having the same amount of pain.  I have less desire to have sex. I want to work on the muscles with perineal massage 1-2 weeks prior to thinking about the dilator.     Patient Stated Goals  reduce pain with intercourse    Currently in Pain?  Yes    Pain Score  5     Pain Location  Vagina    Pain Orientation  Mid    Pain Descriptors / Indicators  Burning;Sore;Cramping    Pain Type  Chronic pain    Pain Onset  More than a month ago    Pain Frequency  Intermittent    Aggravating Factors   tampons, intercourse, vaginal exam    Pain Relieving Factors  no penetration in the vaginal canal                       [facility] Adult PT Treatment/Exercise - 03/20/18 0001      Self-Care   Self-Care  Other Self-Care Comments    Other Self-Care Comments   using coconut oil or v-magic      Neuro Re-ed    Neuro Re-ed Details   ability to bulge the pelvic floor with diaphgramatic breathing using a mirror for visual, using a  mirror with patient performing self perineal massage to the vulva, labia, perineal body             PT Education - 03/20/18 1013    Education Details  education on using coconut oil more often, massaging the perineal area and how to have her husband involved; pelvic floor drop in front of a mirror    Person(s) Educated  Patient    Methods  Explanation;Demonstration;Verbal cues;Handout    Comprehension  Returned demonstration;Verbalized understanding       PT Short Term Goals - 02/21/18 0931      PT SHORT TERM GOAL #1   Title  independent with initial HEP    Time  4    Period  Weeks    Status  Achieved      PT SHORT TERM GOAL #2   Title  understand how to perform perineal massage to hav ethe tissue soften up    Time  4    Period  Weeks    Status  Achieved      PT SHORT TERM GOAL #  3   Title  starting to use dilators to expand the perineal canal for penile penetration    Time  4    Period  Weeks    Status  New      PT SHORT TERM GOAL #4   Title  pain with intercourse decreased >/= 25%    Time  4    Period  Weeks    Status  Achieved        PT Long Term Goals - 03/20/18 1153      PT LONG TERM GOAL #1   Title  independent with HEP and understand how to progress herself    Baseline  still learning    Time  12    Period  Weeks    Status  On-going      PT LONG TERM GOAL #2   Title  ability to use the largest dilator to expand the vaginal canal for intercourse    Baseline  not sure about using dilators    Time  12    Period  Weeks    Status  On-going      PT LONG TERM GOAL #3   Title  penile penetration in vaginal canal decreased >/= 80% due to improved mobility of the vaginal canal    Baseline  50% better    Time  12    Period  Weeks    Status  On-going      PT LONG TERM GOAL #4   Title  abdominal strength >/= 3/5 to perform daily tasks    Time  12    Period  Weeks    Status  On-going            Plan - 03/20/18 0949    Clinical Impression  Statement  Patient continues to have pain with intercourse. Patient is concerned with her sexual desire and how it has decreased. Patient is using coconut oil for vaginal dryness and may want to use another cream. Patient will benefit from skilled therapy to improve tissue mobility and reduce pain for vaginal penetration.     Rehab Potential  Excellent    Clinical Impairments Affecting Rehab Potential  none pertinent    PT Frequency  1x / week    PT Duration  12 weeks    PT Treatment/Interventions  Biofeedback;Cryotherapy;Electrical Stimulation;Ultrasound;Neuromuscular re-education;Therapeutic exercise;Therapeutic activities;Patient/family education;Manual techniques;Dry needling    PT Next Visit Plan  soft tissue work to lower abdominal area; myofascial release to the pelvic floor more internally; see if she needs to start to use the dilator,     Recommended Other Services  MD signed initial eval    Consulted and Agree with Plan of Care  Patient       Patient will benefit from skilled therapeutic intervention in order to improve the following deficits and impairments:  Increased fascial restricitons, Pain, Decreased mobility, Increased muscle spasms, Decreased activity tolerance, Decreased endurance, Decreased strength  Visit Diagnosis: Other muscle spasm  Muscle weakness (generalized)     Problem List Patient Active Problem List   Diagnosis Date Noted  . 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 Contact dermatitis 05/09/2016    Eulis Foster, PT 03/20/18 11:54 AM   [facility] [address], [address] [place], [place], [address] Phone: [phone]   Fax:  [phone]  Name: Catherine Kirk MRN: [id number] Date of Birth: [birth year]

## 2018-03-20 NOTE — Patient Instructions (Signed)
After spin class place ice on the vagina for 10-15 min place towel between the ice and perineum.   Dilators from [email or web address] if you decide to get them.   V-magic on [place] to place on perineum for dryness if coconut oil is not working STRETCHING THE PELVIC FLOOR MUSCLES NO DILATOR  Supplies . Vaginal lubricant . Mirror (optional) . Gloves (optional) Positioning . Start in a semi-reclined position with your head propped up. Bend your knees and place your thumb or finger at the vaginal opening. Procedure . Apply a moderate amount of lubricant on the outer skin of your vagina, the labia minora.  Apply additional lubricant to your finger. Marland Kitchen Spread the skin away from the vaginal opening. Place the end of your finger at the opening. . Do a maximum contraction of the pelvic floor muscles. Tighten the vagina and the anus maximally and relax. . When you know they are relaxed, gently and slowly insert your finger into your vagina, directing your finger slightly downward, for 2-3 inches of insertion. . Relax and stretch the 6 o'clock position . Hold each stretch for _2 min__ and repeat __1_ time with rest breaks of _1__ seconds between each stretch. . Repeat the stretching in the 4 o'clock and 8 o'clock positions. . Total time should be _6__ minutes, _1__ x per day.  Note the amount of theme your were able to achieve and your tolerance to your finger in your vagina. . Once you have accomplished the techniques you may try them in standing with one foot resting on the tub, or in other positions.  This is a good stretch to do in the shower if you don't need to use lubricant.  [facility] [address] [place], [place] [address] Phone # [phone] Fax [phone]

## 2018-04-03 ENCOUNTER — Encounter: Payer: Self-pay | Admitting: Physical Therapy

## 2018-04-03 ENCOUNTER — Ambulatory Visit: Payer: BLUE CROSS/BLUE SHIELD | Attending: Obstetrics & Gynecology | Admitting: Physical Therapy

## 2018-04-03 DIAGNOSIS — M62838 Other muscle spasm: Secondary | ICD-10-CM | POA: Diagnosis present

## 2018-04-03 DIAGNOSIS — M6281 Muscle weakness (generalized): Secondary | ICD-10-CM | POA: Insufficient documentation

## 2018-04-03 NOTE — Patient Instructions (Addendum)
After intercourse or orgasm place a warm hot pack on the vaginal area to relax the muscles  While the heat is on the vagina do your diaphragmatic breathing to relax the pelvic floor  Can get a gel microwavable hot pack you can get at the drugstore or [employer] Code: [email or web address]  URL: [email or web address]  Date: 04/03/2018  Prepared by: Eulis Foster   Exercises  Supine Pelvic Floor Stretch - 1 reps - 1 sets - 1 min hold - 1x daily - 7x weekly  Diaphragmatic Breathing in Supported Child's Pose with Pelvic Floor Relaxation - 1 reps - 1 sets - 2-3 min hold - 1x daily - 7x weekly  [facility] [address], [address] [place], [place] [address] Phone # [phone] Fax [phone]

## 2018-04-03 NOTE — Therapy (Addendum)
[facility] [address], [place], [place], [address] Phone: [phone]   Fax:  [phone]  Physical Therapy Treatment  Patient Details  Name: Catherine Kirk MRN: [id number] Date of Birth: Sep 21, 1985 Referring Provider (PT): Dr. Waymon Amato   Encounter Date: 04/03/2018  PT End of Session - 04/03/18 0955    Visit Number  6    Date for PT Re-Evaluation  07/10/18    Authorization Type  BCBS    PT Start Time  0930    PT Stop Time  1008    PT Time Calculation (min)  38 min    Activity Tolerance  Patient tolerated treatment well    Behavior During Therapy  [facility] for tasks assessed/performed       Past Medical History:  Diagnosis Date  . 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 Dermatitis 05/09/2016    Past Surgical History:  Procedure Laterality Date  . CESAREAN SECTION  2015    There were no vitals filed for this visit.  Subjective Assessment - 04/03/18 0931    Subjective  I am doing the home perineal massage instead of using the dilator. She did some blood test and do not have the results. I am having an ultrasound next week. MD can prescribe a medication for libido. I will finish my birth control to see if that is changing the hormones. After orgasm I am having a pain.  I do Yoga on Wednesdays.     Patient Stated Goals  reduce pain with intercourse    Currently in Pain?  Yes    Pain Score  3     Pain Location  Vagina    Pain Orientation  Mid    Pain Descriptors / Indicators  Burning;Sore;Cramping    Pain Type  Chronic pain    Pain Onset  More than a month ago    Pain Frequency  Intermittent    Aggravating Factors   tampons, intercourse, vaginal exam, after orgasm    Pain Relieving Factors  no penetration in the vaginal canal    Multiple Pain Sites  No         [facility] PT Assessment - 04/03/18 0001      Assessment   Medical Diagnosis  N94.10 unspecified dyspareunia    Referring Provider (PT)  Dr. Waymon Amato     Prior Therapy  none      Precautions   Precautions  None      Restrictions   Weight Bearing Restrictions  No      Home Environment   Living Environment  Private residence      Prior Function   Level of Independence  Independent    Leisure  workout      Cognition   Overall Cognitive Status  Within Functional Limits for tasks assessed      Observation/Other Assessments   Skin Integrity  tightness in the c-section scar      Posture/Postural Control   Posture/Postural Control  No significant limitations      Strength   Overall Strength Comments  abdominal strength 3/5    Right Hip Extension  5/5    Right Hip ABduction  5/5                Pelvic Floor Special Questions - 04/03/18 0001    Currently Sexually Active  Yes    Is this Painful  Yes    Marinoff Scale  discomfort that does not affect completion  Exam Type  Deferred   did not want internal work       [facility] Adult PT Treatment/Exercise - 04/03/18 0001      Self-Care   Self-Care  Other Self-Care Comments    Other Self-Care Comments   using an hot pack after intercourse and orgasm to relax the muscles      Lumbar Exercises: Stretches   Other Lumbar Stretch Exercise  happy baby to stretch pelvic floor; restorative childs pose to relax the pelvic floor and elongate the muscles      Manual Therapy   Manual Therapy  Soft tissue mobilization    Soft tissue mobilization  c-section scar and educated patient on how to massage at home while giving her a handout             PT Education - 04/03/18 1005    Education Details  Access Code: [email or web address]; information on scar massage; information on hot pack to relax the pelvic floor musces    Person(s) Educated  Patient    Methods  Explanation;Demonstration;Verbal cues;Handout    Comprehension  Returned demonstration;Verbalized understanding       PT Short Term Goals - 02/21/18 0931      PT SHORT TERM GOAL #1   Title  independent with initial HEP     Time  4    Period  Weeks    Status  Achieved      PT SHORT TERM GOAL #2   Title  understand how to perform perineal massage to hav ethe tissue soften up    Time  4    Period  Weeks    Status  Achieved      PT SHORT TERM GOAL #3   Title  starting to use dilators to expand the perineal canal for penile penetration    Time  4    Period  Weeks    Status  New      PT SHORT TERM GOAL #4   Title  pain with intercourse decreased >/= 25%    Time  4    Period  Weeks    Status  Achieved        PT Long Term Goals - 04/03/18 1013      PT LONG TERM GOAL #1   Title  independent with HEP and understand how to progress herself    Baseline  still learning    Time  12    Status  On-going      PT LONG TERM GOAL #2   Title  ability to use the largest dilator to expand the vaginal canal for intercourse    Baseline  does not want to use dilators    Time  12    Period  Weeks    Status  Deferred      PT LONG TERM GOAL #3   Title  penile penetration in vaginal canal decreased >/= 80% due to improved mobility of the vaginal canal    Baseline  50% better; pain level is 3/10 compared to 7/10    Time  12    Period  Weeks    Status  On-going      PT LONG TERM GOAL #4   Title  abdominal strength >/= 3/5 to perform daily tasks    Time  12    Period  Weeks    Status  Achieved            Plan - 04/03/18 0946    Clinical Impression  Statement  Patient does not want to use dilators so this goal is deferred. Patient continues to have tightness in the c-section scar. Patient has increased abdominal and hip strength. Patient is had blood work to assess her hormonal level and will finish her birth control and stop to see if that will change her pelvic floor. Patient is performing her self perineal soft tissue work 4 days per week and feels a difference. Patient Marinoff score is 1/3. She reports the pain level is 3/10 instead of 7/10.  Patient will benefit from skilled therapy to work on pelvic  floor elongation and reduction of pain with orgasm and penile penetration.     Rehab Potential  Excellent    Clinical Impairments Affecting Rehab Potential  none pertinent    PT Frequency  1x / week    PT Duration  12 weeks    PT Treatment/Interventions  Biofeedback;Cryotherapy;Electrical Stimulation;Ultrasound;Neuromuscular re-education;Therapeutic exercise;Therapeutic activities;Patient/family education;Manual techniques;Dry needling    PT Next Visit Plan  see patient in 3-4 weeks, update HEP; soft tissue work to lower abdominal area; myofascial release to the pelvic floor more internally;     PT Home Exercise Plan  Access Code: [email or web address]     Recommended Other Services  sent MD renewal    Consulted and Agree with Plan of Care  Patient       Patient will benefit from skilled therapeutic intervention in order to improve the following deficits and impairments:  Increased fascial restricitons, Pain, Decreased mobility, Increased muscle spasms, Decreased activity tolerance, Decreased endurance, Decreased strength  Visit Diagnosis: Other muscle spasm - Plan: PT plan of care cert/re-cert  Muscle weakness (generalized) - Plan: PT plan of care cert/re-cert     Problem List Patient Active Problem List   Diagnosis Date Noted  . 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 Contact dermatitis 05/09/2016    Earlie Counts, PT 04/03/18 10:17 AM   [facility] [address] [place], [place], [address] Phone: [phone]   Fax:  [phone]  Name: Nimrit Kehres MRN: [id number] Date of Birth: April 03, 1985 PHYSICAL THERAPY DISCHARGE SUMMARY  Visits from Start of Care: 6  Current functional level related to goals / functional outcomes: See above. Spoke to patient on 05/29/2018. Patient reports she is doing well and does not need therapy at this time. Patient felt therapy has helped her. Patient wants to be discharged.    Remaining  deficits: See above.    Education / Equipment: HEP Plan: Patient agrees to discharge.  Patient goals were met. Patient is being discharged due to being pleased with the current functional level. Thank you for the referral. Earlie Counts, PT 05/29/18 10:15 AM   ?????

## 2018-04-26 ENCOUNTER — Encounter: Payer: BLUE CROSS/BLUE SHIELD | Admitting: Physical Therapy

## 2018-05-02 ENCOUNTER — Encounter: Payer: BLUE CROSS/BLUE SHIELD | Admitting: Physical Therapy

## 2019-09-02 ENCOUNTER — Other Ambulatory Visit: Payer: Self-pay | Admitting: Internal Medicine

## 2019-09-02 DIAGNOSIS — R7989 Other specified abnormal findings of blood chemistry: Secondary | ICD-10-CM

## 2019-09-25 ENCOUNTER — Other Ambulatory Visit: Payer: Self-pay | Admitting: Internal Medicine

## 2019-09-27 ENCOUNTER — Ambulatory Visit
Admission: RE | Admit: 2019-09-27 | Discharge: 2019-09-27 | Disposition: A | Discharge status: Home / Self Care | Payer: BC Managed Care – PPO | Source: Ambulatory Visit | Attending: Internal Medicine | Admitting: Internal Medicine

## 2019-09-27 ENCOUNTER — Other Ambulatory Visit: Payer: Self-pay

## 2019-09-27 DIAGNOSIS — R7989 Other specified abnormal findings of blood chemistry: Secondary | ICD-10-CM

## 2019-09-27 MED ORDER — GADOBENATE DIMEGLUMINE 529 MG/ML IV SOLN
7.0000 mL | Freq: Once | INTRAVENOUS | Status: AC | PRN
  Administered 2019-09-27: 7 mL via INTRAVENOUS
